# Patient Record
Sex: Male | Born: 1995 | Race: White | Hispanic: No | Marital: Single | Smoking: Never smoker
Health system: Southern US, Community
[De-identification: ages and names within clinical notes are randomized; demographics above are authoritative.]

---
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
## 2015-05-11 IMAGING — CR DG HIP (WITH OR WITHOUT PELVIS) 2-3V*L*
2 series · 2 of 2 positions shown · non-contrast
Comparison: None.

CLINICAL DATA: Chronic numbness and tingling in the left hip.

EXAM:
LEFT HIP - COMPLETE 2+ VIEW

[view not recorded (1 of 2)]
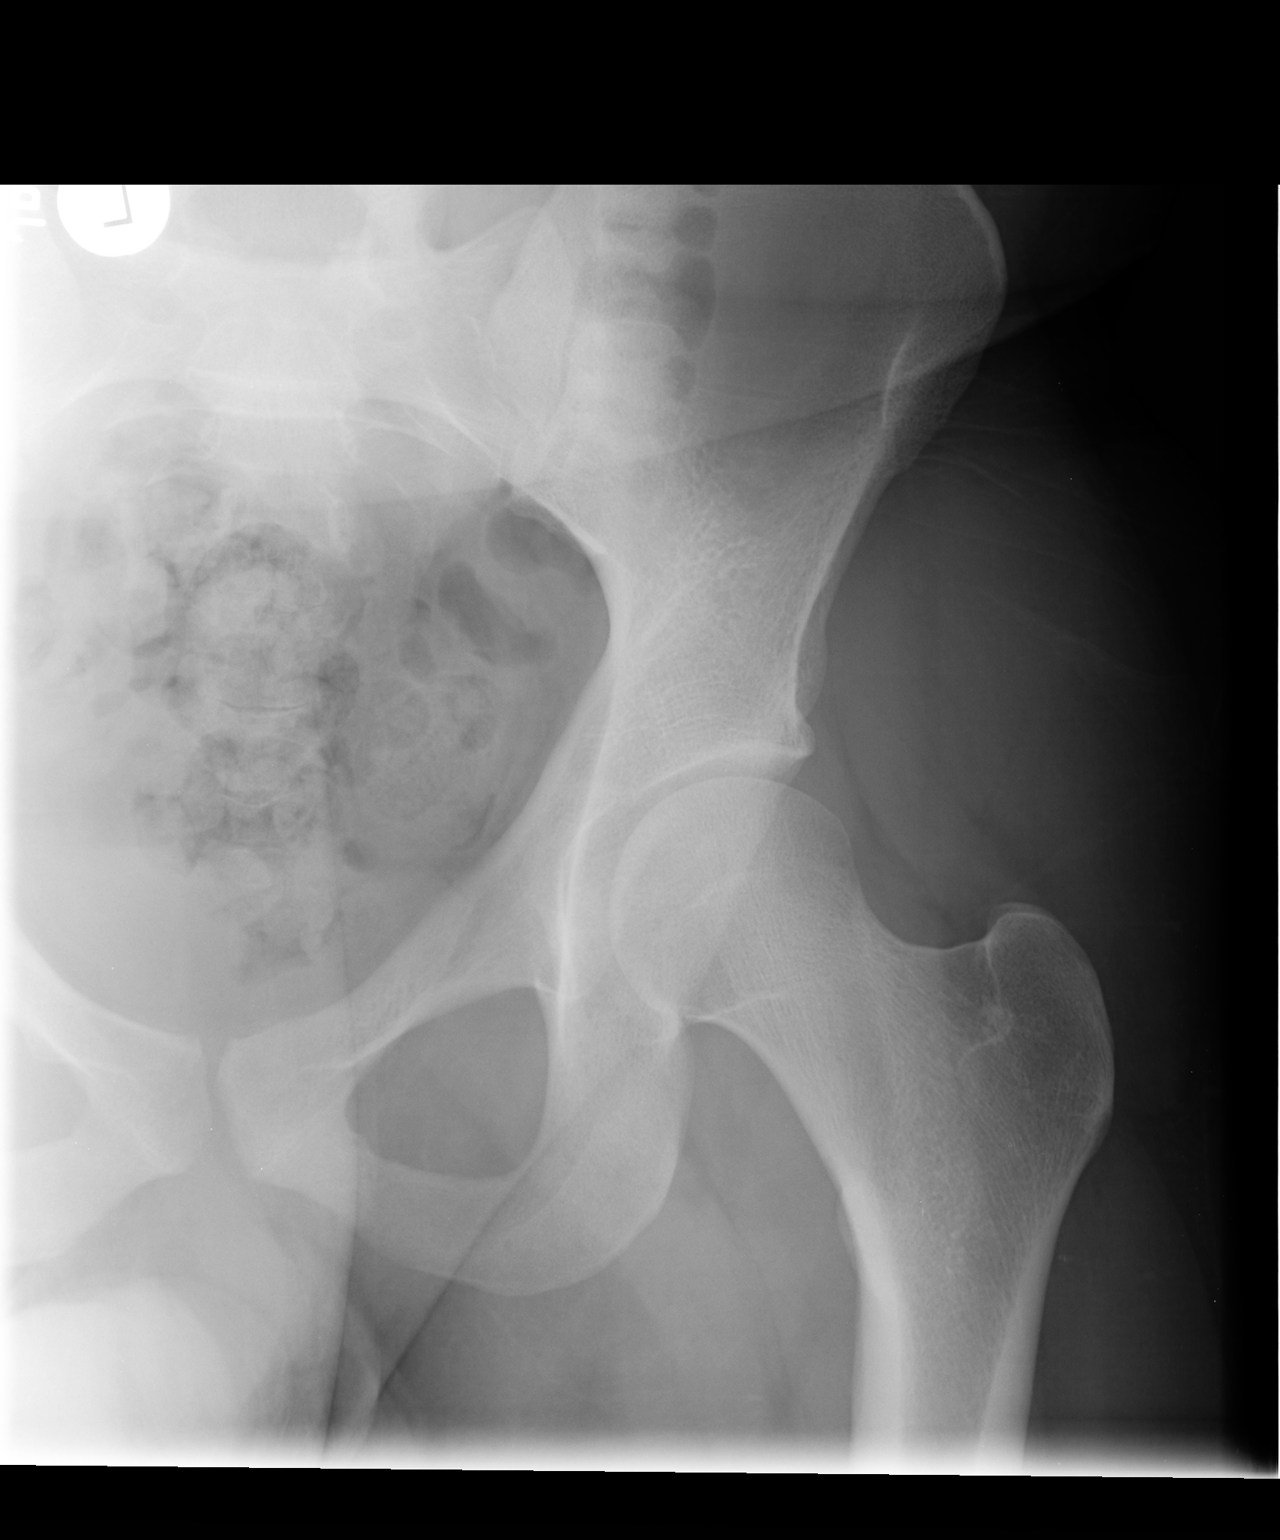

[view not recorded (2 of 2)]
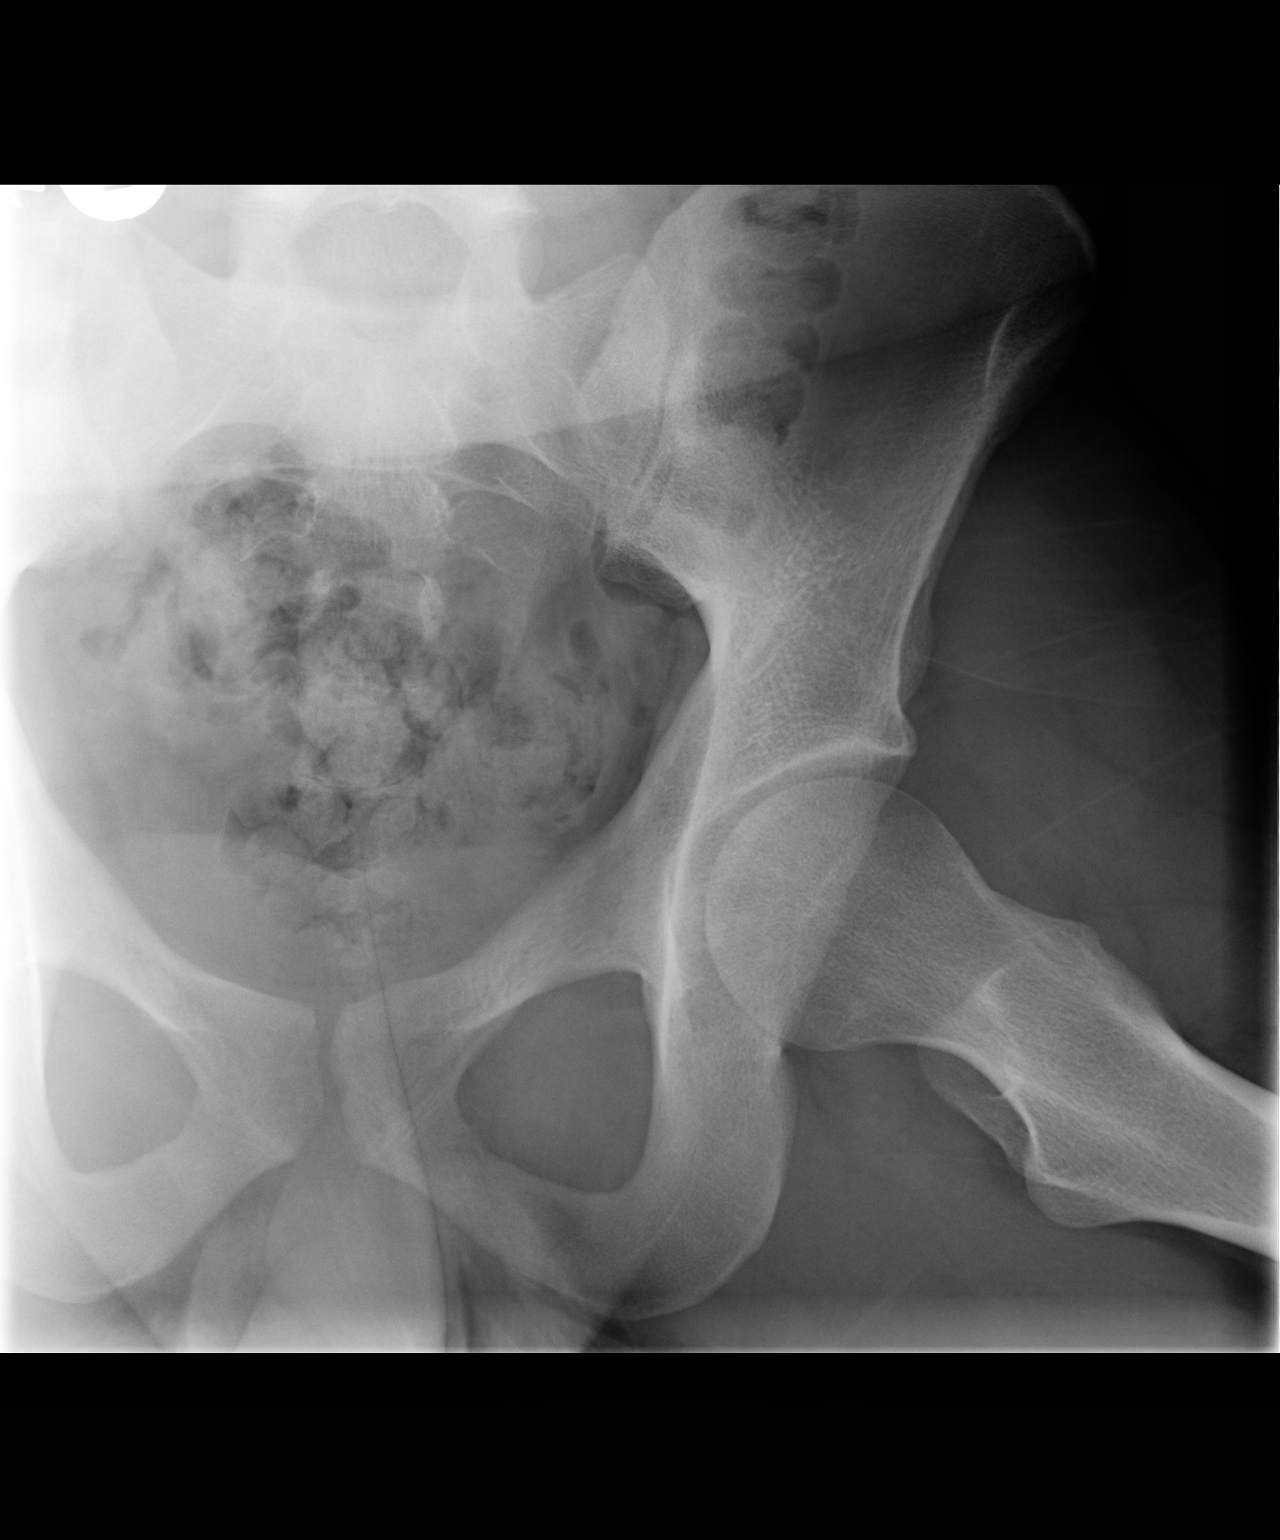

[2 of 2 positions shown; findings below may reference images not displayed]

FINDINGS: Imaged bones, joints and soft tissues appear normal.
IMPRESSION: Negative exam.

## 2019-12-31 DIAGNOSIS — M79675 Pain in left toe(s): Secondary | ICD-10-CM | POA: Diagnosis not present

## 2019-12-31 DIAGNOSIS — M25532 Pain in left wrist: Secondary | ICD-10-CM | POA: Diagnosis not present

## 2019-12-31 DIAGNOSIS — S66912A Strain of unspecified muscle, fascia and tendon at wrist and hand level, left hand, initial encounter: Secondary | ICD-10-CM | POA: Diagnosis not present

## 2021-01-26 ENCOUNTER — Other Ambulatory Visit: Payer: Self-pay

## 2021-01-26 ENCOUNTER — Encounter (HOSPITAL_COMMUNITY): Payer: Self-pay

## 2021-01-26 ENCOUNTER — Ambulatory Visit (HOSPITAL_COMMUNITY)
Admission: EM | Admit: 2021-01-26 | Discharge: 2021-01-26 | Disposition: A | Discharge status: Home / Self Care | Payer: BC Managed Care – PPO | Attending: Emergency Medicine | Admitting: Emergency Medicine

## 2021-01-26 DIAGNOSIS — K5900 Constipation, unspecified: Secondary | ICD-10-CM | POA: Diagnosis not present

## 2021-01-26 DIAGNOSIS — R1033 Periumbilical pain: Secondary | ICD-10-CM

## 2021-01-26 NOTE — Discharge Instructions (Addendum)
Extra fluids, aim for at least 2 L of water a day.  Try some docusate in addition to the fiber pills, polyethylene glycol.  You can take the polyethylene glycol twice a day.  It may take up to 3 days for the miralax to take effect. may also drink prune and apple juice. Return to the ER if you have a fever, if you start having severe abdominal pain,, abdominal distention, a fever >100.4, or any other concerns.    Below is a list of primary care practices who are taking new patients for you to follow-up with.  [facility] -multiple locations.  See website at [email or web address]  [facility] internal medicine clinic Ground Floor - [facility], [place], [place], [place] [address] [phone]  [facility] at [facility] [address] [place], [place] [address] [phone]  [place], [place] [address] [phone]  [facility] [phone] [place] [address]   family Practice Center: [place]  [phone]  [facility]: [address] [place]  [phone]  [place] [facility] : [address] [phone]  [facility] Primary [facility]: [address] [place] [phone]  [facility]: [place] [phone]  Dr. Blanchie Serve [place] [address]  [phone]  Go to www.goodrx.com  or www.[email or web address] to look up your medications. This will give you a list of where you can find your prescriptions at the most affordable prices. Or ask the pharmacist what the cash price is, or if they have any other discount  programs available to help make your medication more affordable. This can be less expensive than what you would pay with insurance.

## 2021-01-26 NOTE — ED Provider Notes (Signed)
HPI  SUBJECTIVE:  Kenneth Wang is a 26 y.o. male who presents with 4 days of sharp, constant midline/periumbilical abdominal and back pressure/discomfort that does not migrate or radiate.  He states that he is passing only a small amount of soft stools.  He is still passing gas.  No change in his diet.  He reports nausea, having the urge to defecate, and pain with defecation if he "pushes hard".  No vomiting, fevers, abdominal distention, melena, hematochezia, anorexia.  He does not take any iron pills or medications on a regular basis.  he states that he drinks about 2 L of water a day.  He has tried fiber pills, MiraLAX, heating pad and increasing his fluid intake.  The fiber pills, stool softener and drinking coffe help.  Stooling also improves his abdominal discomfort.  Symptoms are worse with bending forward, movement.  He states that he normally stools 2 or 3 times a day.  He also reports decreased urine output despite increasing fluids.  No antipyretic in the past 6 hours.  He has never had symptoms like this before.  Past medical history negative for diabetes, hypertension, atrial fibrillation, mesenteric ischemia, abdominal surgeries, pancreatitis, gallbladder disease, hypercholesterolemia, IBS, ulcerative colitis.  PMD: None  History reviewed. No pertinent past medical history.  History reviewed. No pertinent surgical history.  History reviewed. No pertinent family history.  Social History   Tobacco Use   Smoking status: Never   Smokeless tobacco: Never  Vaping Use   Vaping Use: Never used  Substance Use Topics   Alcohol use: Never   Drug use: Never    No current facility-administered medications for this encounter. No current outpatient medications on file.  No Known Allergies   ROS  As noted in HPI.   Physical Exam  BP 138/87 (BP Location: Left Arm)    Pulse 66    Resp 17    SpO2 98%   Constitutional: Well developed, well nourished, no acute distress Eyes:  EOMI,  conjunctiva normal bilaterally HENT: Normocephalic, atraumatic,mucus membranes moist Respiratory: Normal inspiratory effort Cardiovascular: Normal rate, regular rhythm, no murmurs, rubs, GI: nondistended.  Normal appearance, soft, nontender, active bowel sounds, no rebound, guarding. Rectal: Patient declined skin: No rash, skin intact Musculoskeletal: no deformities Neurologic: Alert & oriented x 3, no focal neuro deficits Psychiatric: Speech and behavior appropriate   ED Course   Medications - No data to display  No orders of the defined types were placed in this encounter.   No results found for this or any previous visit (from the past 24 hour(s)). No results found.  ED Clinical Impression  1. Constipation, unspecified constipation type   2. Periumbilical abdominal pain      ED Assessment/Plan  Pt abd exam is benign, no peritoneal signs. No evidence of surgical abd. Doubt SBO, as he is still passing stool, gas, and his abdomen is soft.  Doubt mesenteric ischemia, appendicitis, hepatitis, cholecystitis, pancreatitis, or perforated viscus.   Will have patient continue fiber pills, continue increasing his fluid intake, the polyethylene glycol. will have him start docusate as well.  Will provide primary care list and order assistance in finding a PCP.  Discussed MDM, treatment plan, and plan for follow-up with patient. Discussed sn/sx that should prompt return to the ED. patient agrees with plan.   No orders of the defined types were placed in this encounter.     *This clinic note was created using Dragon dictation software. Therefore, there may be occasional mistakes despite careful  proofreading.  ?    Melynda Ripple, MD 01/27/21 2016

## 2021-01-26 NOTE — ED Triage Notes (Signed)
Pt reports constipation x 4 days.   States if he drinks coffee he can have a bowel movement. States during the day he is unable pass stool. States he is in more discomfort than pain.   States he has back pain when having stomach pain.

## 2021-04-24 DIAGNOSIS — S99921A Unspecified injury of right foot, initial encounter: Secondary | ICD-10-CM | POA: Diagnosis not present

## 2021-04-24 DIAGNOSIS — X58XXXA Exposure to other specified factors, initial encounter: Secondary | ICD-10-CM | POA: Diagnosis not present

## 2021-04-24 DIAGNOSIS — M7989 Other specified soft tissue disorders: Secondary | ICD-10-CM | POA: Diagnosis not present

## 2021-04-24 DIAGNOSIS — M79671 Pain in right foot: Secondary | ICD-10-CM | POA: Diagnosis not present

## 2021-09-15 DIAGNOSIS — F902 Attention-deficit hyperactivity disorder, combined type: Secondary | ICD-10-CM | POA: Diagnosis not present

## 2021-09-15 DIAGNOSIS — M7052 Other bursitis of knee, left knee: Secondary | ICD-10-CM | POA: Diagnosis not present

## 2021-09-15 DIAGNOSIS — G43909 Migraine, unspecified, not intractable, without status migrainosus: Secondary | ICD-10-CM | POA: Diagnosis not present

## 2021-09-15 DIAGNOSIS — Z23 Encounter for immunization: Secondary | ICD-10-CM | POA: Diagnosis not present

## 2021-10-18 DIAGNOSIS — R509 Fever, unspecified: Secondary | ICD-10-CM | POA: Diagnosis not present

## 2021-10-18 DIAGNOSIS — B349 Viral infection, unspecified: Secondary | ICD-10-CM | POA: Diagnosis not present

## 2021-10-18 DIAGNOSIS — R051 Acute cough: Secondary | ICD-10-CM | POA: Diagnosis not present

## 2021-10-18 DIAGNOSIS — Z20822 Contact with and (suspected) exposure to covid-19: Secondary | ICD-10-CM | POA: Diagnosis not present

## 2021-11-16 DIAGNOSIS — J40 Bronchitis, not specified as acute or chronic: Secondary | ICD-10-CM | POA: Diagnosis not present

## 2022-01-18 DIAGNOSIS — H66001 Acute suppurative otitis media without spontaneous rupture of ear drum, right ear: Secondary | ICD-10-CM | POA: Diagnosis not present

## 2022-09-19 DIAGNOSIS — Z0000 Encounter for general adult medical examination without abnormal findings: Secondary | ICD-10-CM | POA: Diagnosis not present
